# Patient Record
Sex: Male | Born: 1975 | Race: Black or African American | Hispanic: No | Marital: Married | Smoking: Never smoker
Health system: Southern US, Community
[De-identification: ages and names within clinical notes are randomized; demographics above are authoritative.]

## PROBLEM LIST (undated history)

## (undated) DIAGNOSIS — G44009 Cluster headache syndrome, unspecified, not intractable: Secondary | ICD-10-CM

## (undated) HISTORY — DX: Cluster headache syndrome, unspecified, not intractable: G44.009

---

## 2006-03-15 HISTORY — PX: ACHILLES TENDON SURGERY: SHX542

## 2014-07-09 ENCOUNTER — Telehealth: Payer: Self-pay | Admitting: *Deleted

## 2014-07-09 NOTE — Telephone Encounter (Signed)
Unable to reach patient at time of Pre-Visit Call.  Left message for patient to return call when available.    

## 2014-07-10 ENCOUNTER — Telehealth: Payer: Self-pay

## 2014-07-10 ENCOUNTER — Encounter: Payer: Self-pay | Admitting: Physician Assistant

## 2014-07-10 ENCOUNTER — Ambulatory Visit (INDEPENDENT_AMBULATORY_CARE_PROVIDER_SITE_OTHER): Payer: 59 | Admitting: Physician Assistant

## 2014-07-10 ENCOUNTER — Ambulatory Visit (HOSPITAL_BASED_OUTPATIENT_CLINIC_OR_DEPARTMENT_OTHER)
Admission: RE | Admit: 2014-07-10 | Discharge: 2014-07-10 | Disposition: A | Discharge status: Home / Self Care | Payer: 59 | Source: Ambulatory Visit | Attending: Physician Assistant | Admitting: Physician Assistant

## 2014-07-10 VITALS — BP 126/80 | HR 79 | Temp 98.2°F | Resp 18 | Ht 71.75 in | Wt 204.0 lb

## 2014-07-10 DIAGNOSIS — R079 Chest pain, unspecified: Secondary | ICD-10-CM

## 2014-07-10 MED ORDER — MELOXICAM 15 MG PO TABS: 15.0000 mg | ORAL_TABLET | Freq: Every day | ORAL | Status: DC

## 2014-07-10 NOTE — Patient Instructions (Signed)
Please go downstairs for x-ray. I will call you with your results. Please take the Mobic daily with food. Apply topical Aspercreme to the area. Limit heavy lifting.  Follow-up in 1 month.

## 2014-07-10 NOTE — Progress Notes (Signed)
   Patient presents to clinic today to establish care.  Patient endorses R-sided pectoral pain intermittently x 2 months.  Endorses injury while zip-lining. Endorses symptoms initially improved, but have recurred.  Is sometimes worse with movement.  Denies SOB, palpitations, dizziness, lightheadedness, vision changes. BP 126/80. Has not taken anything for symptoms. Has previously seen an Orthopedic Surgeon but states " they were not helpful"   Past Medical History  Diagnosis Date  . Cluster headache     Past Surgical History  Procedure Laterality Date  . Achilles tendon surgery  2008    No current outpatient prescriptions on file prior to visit.   No current facility-administered medications on file prior to visit.    Allergies  Allergen Reactions  . Penicillins Other (See Comments)    Unsure childhood allergy     Family History  Problem Relation Age of Onset  . Hypertension Mother   . Alcohol abuse Father     History   Social History  . Marital Status: Married    Spouse Name: N/A  . Number of Children: N/A  . Years of Education: N/A   Occupational History  . Not on file.   Social History Main Topics  . Smoking status: Never Smoker   . Smokeless tobacco: Not on file  . Alcohol Use: Yes  . Drug Use: No  . Sexual Activity: Not on file   Other Topics Concern  . Not on file   Social History Narrative  . No narrative on file   ROS Pertinent ROS are listed in the HPI.  BP 126/80 mmHg  Pulse 79  Temp(Src) 98.2 F (36.8 C) (Oral)  Resp 18  Ht 5' 11.75" (1.822 m)  Wt 204 lb (92.534 kg)  BMI 27.87 kg/m2  SpO2 98%  Physical Exam  Constitutional: He is well-developed, well-nourished, and in no distress.  HENT:  Head: Normocephalic and atraumatic.  Eyes: Conjunctivae are normal.  Cardiovascular: Normal rate, regular rhythm, normal heart sounds and intact distal pulses.   Pulmonary/Chest: Effort normal and breath sounds normal. No respiratory distress.  He has no wheezes. He has no rales. He exhibits tenderness.  Neurological: He is alert.  Skin: Skin is warm and dry.  Psychiatric: Affect normal.  Vitals reviewed.  Assessment/Plan: Right-sided chest pain Will obtain x-ray to further assess.  Seems MSK in nature. Will begin Mobic.  RICE therapy discussed.  Follow-up in 1 month.

## 2014-07-10 NOTE — Progress Notes (Signed)
Pre visit review using our clinic review tool, if applicable. No additional management support is needed unless otherwise documented below in the visit note. 

## 2014-07-10 NOTE — Telephone Encounter (Signed)
-----   Message from Waldon MerlWilliam C Martin, PA-C sent at 07/10/2014 11:10 AM EDT ----- X-ray negative for concerning findings.  Continue care discussed at visit.  Follow-up in 1 month.

## 2014-07-10 NOTE — Telephone Encounter (Signed)
Notified of results and verbalized understanding. No questions at this time.

## 2014-07-10 NOTE — Assessment & Plan Note (Signed)
Will obtain x-ray to further assess.  Seems MSK in nature. Will begin Mobic.  RICE therapy discussed.  Follow-up in 1 month.

## 2014-08-09 ENCOUNTER — Ambulatory Visit: Payer: 59 | Admitting: Physician Assistant

## 2014-08-09 DIAGNOSIS — Z0289 Encounter for other administrative examinations: Secondary | ICD-10-CM

## 2014-08-16 ENCOUNTER — Telehealth: Payer: Self-pay | Admitting: Physician Assistant

## 2014-08-16 ENCOUNTER — Encounter: Payer: Self-pay | Admitting: Medical

## 2014-08-16 ENCOUNTER — Ambulatory Visit (INDEPENDENT_AMBULATORY_CARE_PROVIDER_SITE_OTHER): Payer: 59 | Admitting: Medical

## 2014-08-16 VITALS — BP 128/90 | HR 82 | Temp 97.9°F | Ht 71.75 in | Wt 212.4 lb

## 2014-08-16 DIAGNOSIS — J040 Acute laryngitis: Secondary | ICD-10-CM

## 2014-08-16 DIAGNOSIS — S29091D Other injury of muscle and tendon of front wall of thorax, subsequent encounter: Secondary | ICD-10-CM

## 2014-08-16 DIAGNOSIS — J301 Allergic rhinitis due to pollen: Secondary | ICD-10-CM

## 2014-08-16 DIAGNOSIS — S29011D Strain of muscle and tendon of front wall of thorax, subsequent encounter: Secondary | ICD-10-CM

## 2014-08-16 DIAGNOSIS — J309 Allergic rhinitis, unspecified: Secondary | ICD-10-CM | POA: Insufficient documentation

## 2014-08-16 MED ORDER — LORATADINE 10 MG PO TABS: 10.0000 mg | ORAL_TABLET | Freq: Every day | ORAL | Status: DC

## 2014-08-16 MED ORDER — FLUTICASONE PROPIONATE 50 MCG/ACT NA SUSP: 2.0000 | Freq: Every day | NASAL | Status: DC

## 2014-08-16 MED ORDER — AZITHROMYCIN 250 MG PO TABS: ORAL_TABLET | ORAL | Status: DC

## 2014-08-16 NOTE — Patient Instructions (Signed)
Laryngitis Rest voice, hydrate. If persists by Monday start azithromycin. If any persisting hoarse voice by 2 wks then would need to refer to ENT.   Allergic rhinitis Rx flonase and claritin.(recent hx and physical exam supports dx of allergy).    Follow up 7 days or as needed

## 2014-08-16 NOTE — Telephone Encounter (Signed)
No charge 1st time.  Will charge if happens again.

## 2014-08-16 NOTE — Progress Notes (Signed)
   Subjective:    Patient ID: Preston Cox, male    DOB: 1975/06/16, 39 y.o.   MRN: [id number]  HPI  Pt in with hoarse voice since Tuesday. Last weekend felt cough, congestion and runny nose. Overall feels better but still occasioanal dry cough.(Not completely better). Occasional sneeze. No st. No fevers, no chills or sweats recently. Then states maybe some fever over weekend but never check his temp.   Pt does not smoke.  Pt has some rt side pectoralis pain for 3 months. Pain on movement works out occasionally. Pt had xray. Was negative. Pt wants to see sports medicine. Ortho did not offer much advise per pt. No left side pain.No back pain. No sob. Mentioned rt pec at very end of intermiew as about to leave room.   Review of Systems See hpi    Objective:   Physical Exam  General  Mental Status - Alert. General Appearance - Well groomed. Not in acute distress. But hoarse voice.  Skin Rashes- No Rashes.  HEENT Head- Normal. Ear Auditory Canal - Left- Normal. Right - Normal.Tympanic Membrane- Left- Normal. Right- Normal. Eye Sclera/Conjunctiva- Left- Normal. Right- Normal. Nose & Sinuses Nasal Mucosa- Left-  Boggy and Congested. Right-  Boggy and  Congested.Bilateral no  maxillary and  No frontal sinus pressure. Mouth & Throat Lips: Upper Lip- Normal: no dryness, cracking, pallor, cyanosis, or vesicular eruption. Lower Lip-Normal: no dryness, cracking, pallor, cyanosis or vesicular eruption. Buccal Mucosa- Bilateral- No Aphthous ulcers. Oropharynx- No Discharge or Erythema. +pnd. Tonsils: Characteristics- Bilateral- No Erythema or Congestion. Size/Enlargement- Bilateral- No enlargement. Discharge- bilateral-None.  Neck Neck- Supple. No Masses.   Chest and Lung Exam Auscultation: Breath Sounds:-Clear even and unlabored.  Cardiovascular Auscultation:Rythm- Regular, rate and rhythm. Murmurs & Other Heart Sounds:Ausculatation of the heart reveal- No  Murmurs.  Lymphatic Head & Neck General Head & Neck Lymphatics: Bilateral: Description- No Localized lymphadenopathy.  Rt pec- on lifting shoulder reports mid upper pec faint paint.       Assessment & Plan:

## 2014-08-16 NOTE — Telephone Encounter (Signed)
Patient was no show for follow up on 08/09/14- charge?

## 2014-08-16 NOTE — Assessment & Plan Note (Signed)
Rest voice, hydrate. If persists by Monday start azithromycin. If any persisting hoarse voice by 2 wks then would need to refer to ENT.

## 2014-08-16 NOTE — Assessment & Plan Note (Signed)
Rx flonase and claritin.(recent hx and physical exam supports dx of allergy).

## 2014-08-16 NOTE — Progress Notes (Signed)
Pre visit review using our clinic review tool, if applicable. No additional management support is needed unless otherwise documented below in the visit note. 

## 2014-08-21 ENCOUNTER — Ambulatory Visit: Payer: 59 | Admitting: Family Medicine

## 2014-11-15 ENCOUNTER — Telehealth: Payer: Self-pay | Admitting: Physician Assistant

## 2014-11-15 NOTE — Telephone Encounter (Signed)
[facility] - Client TELEPHONE ADVICE RECORD [facility] Patient Name: Preston Cox Gender: Male DOB: [birth year] Age: 40 Y 7 M 16 D Return Phone Number: [phone] (Primary) Address: City/State/Zip: [place] [facility] - Client Client Site [facility] - Day Physician Saguier, Edward Contact Type Call Call Type Triage / Clinical Relationship To Patient Self Appointment Disposition EMR Appointment Not Necessary Info pasted into Epic Yes Return Phone Number [phone] (Primary) Chief Complaint CHEST PAIN (>=21 years) - pain, pressure, heaviness or tightness Initial Comment Caller states having tightness in chest breathing is labored PreDisposition Call Doctor Nurse Assessment Nurse: Roderic Ovens, RN, Amy Date/Time (Eastern Time): 11/15/2014 3:55:20 PM Confirm and document reason for call. If symptomatic, describe symptoms. ---CALLER STATES THAT HE IS HAVING SOME CHEST TIGHTNESS. HE STATES THE CHEST TIGHTNESS HAS BEEN STAYING THERE FOR A COUPLE OF DAYS. HE IS HAVING SOB AS WELL. RIGHT SIDE OF HIS CHEST IS SHARP PAIN CONSTANT PAIN. Has the patient traveled out of the country within the last 30 days? ---Not Applicable Does the patient require triage? ---Yes Related visit to physician within the last 2 weeks? ---No Does the PT have any chronic conditions? (i.e. diabetes, asthma, etc.) ---No Guidelines Guideline Title Affirmed Question Affirmed Notes Nurse Date/Time (Eastern Time) Chest Pain Patient sounds very sick or weak to the triager HAS SOME DIFFICULTY BREATHING ISSUES, CHEST PAIN/ DISCOMFORT ISSUES GOING ON. HE DOES NOT KNOW HOW TO REALLY DESCRIBE THE ISSUES HE IS HAVING. HE STATES THIS HAS BEEN GOING ON FOR ABOUT 3 DAYS NOW. [place], RN, Amy 11/15/2014 3:56:13 PM PLEASE NOTE: All timestamps contained within this report are represented as [place] Standard  Time. CONFIDENTIALTY NOTICE: This fax transmission is intended only for the addressee. It contains information that is legally privileged, confidential or otherwise protected from use or disclosure. If you are not the intended recipient, you are strictly prohibited from reviewing, disclosing, copying using or disseminating any of this information or taking any action in reliance on or regarding this information. If you have received this fax in error, please notify us immediately by telephone so that we can arrange for its return to [place]. Phone: [phone], Toll-Free: [phone], Fax: [phone] Page: 2 of 2 Call Id: [id number] Disp. Time Lamount Cohen Time) Disposition Final User 11/15/2014 3:49:08 PM Send to Urgent [place]oy Register, Lorie 11/15/2014 4:04:47 PM Go to ED Now (or PCP triage) Yes Roderic Ovens, RN, Amy Caller Understands: Yes Disagree/Comply: Disagree Disagree/Comply Reason: Disagree with instructions Care Advice Given Per Guideline GO TO ED NOW (OR PCP TRIAGE): CARE ADVICE given per Chest Pain (Adult) guideline. DRIVING: Another adult should drive. After Care Instructions Given Call Event Type User Date / Time Description Comments User: Brent Bulla, RN Date/Time Lamount Cohen Time): 11/15/2014 4:07:13 PM CALLER IS REFUSING TO HAVE ED OUTCOME. HE ONLY WANTS TO SCHEDULE AN APPT. INFORMED HIM WOULD CALL THE OFFICE AND SPEAK WITH THE NURSE AND LEAVE THE INFORMATION WITH THEM. SOMEONE FROM THE OFFICE WILL CALL HIM BACK. Referrals GO TO FACILITY REFUSED

## 2014-11-15 NOTE — Telephone Encounter (Signed)
Called patient at [phone] (Mobile) *Preferred* and left message to return call.  

## 2014-11-15 NOTE — Telephone Encounter (Signed)
Caller name: Amy Relationship to patient: Triage Nurse  Can be reached:  Pharmacy:  Reason for call: called in was having chest tightening. She would just like to let you know that pt refuses to go to the ER he only want to come into the office

## 2014-11-15 NOTE — Telephone Encounter (Signed)
Please call patient and advise him to be seen at an Urgent Care today if he will not go to the ER.

## 2014-11-20 NOTE — Telephone Encounter (Signed)
Called patient at [phone] [facility]) *Preferred* and left message to return call.

## 2014-12-17 ENCOUNTER — Encounter: Payer: Self-pay | Admitting: Physician Assistant

## 2014-12-17 ENCOUNTER — Ambulatory Visit (INDEPENDENT_AMBULATORY_CARE_PROVIDER_SITE_OTHER): Payer: 59 | Admitting: Physician Assistant

## 2014-12-17 VITALS — BP 137/78 | HR 72 | Temp 97.9°F | Resp 16 | Ht 71.75 in | Wt 215.4 lb

## 2014-12-17 DIAGNOSIS — R0789 Other chest pain: Secondary | ICD-10-CM | POA: Diagnosis not present

## 2014-12-17 MED ORDER — MONTELUKAST SODIUM 10 MG PO TABS: 10.0000 mg | ORAL_TABLET | Freq: Every day | ORAL | Status: DC

## 2014-12-17 NOTE — Progress Notes (Signed)
   Patient presents to clinic today c/o intermittent chest pressure over the past month. Has also had some mild allergy symptoms. Denies overt chest pain, SOB, palpitations, LH or dizziness. Has had recent x-ray for R-sided MSK chest pain that was unremarkable. Is not taking anything for his symptoms.  BP Readings from Last 3 Encounters:  12/17/14 137/78  08/16/14 128/90  07/10/14 126/80    Past Medical History  Diagnosis Date  . Cluster headache     No current outpatient prescriptions on file prior to visit.   No current facility-administered medications on file prior to visit.    Allergies  Allergen Reactions  . Penicillins Other (See Comments)    Unsure childhood allergy     Family History  Problem Relation Age of Onset  . Hypertension Mother   . Alcohol abuse Father     Social History   Social History  . Marital Status: Married    Spouse Name: N/A  . Number of Children: N/A  . Years of Education: N/A   Social History Main Topics  . Smoking status: Never Smoker   . Smokeless tobacco: None  . Alcohol Use: Yes  . Drug Use: No  . Sexual Activity: Not Asked   Other Topics Concern  . None   Social History Narrative   Review of Systems - See HPI.  All other ROS are negative.  BP 137/78 mmHg  Pulse 72  Temp(Src) 97.9 F (36.6 C) (Oral)  Resp 16  Ht 5' 11.75" (1.822 m)  Wt 215 lb 6 oz (97.693 kg)  BMI 29.43 kg/m2  SpO2 100%  Physical Exam  Constitutional: He is oriented to person, place, and time and well-developed, well-nourished, and in no distress.  HENT:  Head: Normocephalic and atraumatic.  Eyes: Conjunctivae are normal.  Cardiovascular: Normal rate, regular rhythm, normal heart sounds and intact distal pulses.   Pulmonary/Chest: Effort normal and breath sounds normal. No respiratory distress. He has no wheezes. He has no rales. He exhibits no tenderness.  Neurological: He is alert and oriented to person, place, and time.  Skin: Skin is warm and  dry. No rash noted.  Psychiatric: Affect normal.  Vitals reviewed.   No results found for this or any previous visit (from the past 2160 hour(s)).  Assessment/Plan: Chest tightness Recent negative CXR. EKG with NSR. Exam unremarkable. 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 Will resume Flonase and begin Singulair daily for 2 week trial. If not responding, will refer to Pulmonology.

## 2014-12-17 NOTE — Progress Notes (Signed)
Pre visit review using our clinic review tool, if applicable. No additional management support is needed unless otherwise documented below in the visit note/SLS  

## 2014-12-17 NOTE — Patient Instructions (Addendum)
Please take the Singulair daily over the next 2 weeks to see if this improves symptoms. I suspect there is some mild allergic reactive airway as cause of your symptoms. If symptoms not improving, we will need to get you in to Pulmonology for assessment.

## 2014-12-17 NOTE — Assessment & Plan Note (Signed)
Recent negative CXR. EKG with NSR. Exam unremarkable. 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 Will resume Flonase and begin Singulair daily for 2 week trial. If not responding, will refer to Pulmonology.

## 2015-01-30 ENCOUNTER — Telehealth: Payer: Self-pay | Admitting: Behavioral Health

## 2015-01-30 NOTE — Telephone Encounter (Signed)
Unable to reach patient at time of Pre-Visit Call. Message could not be left due to voice mailbox being full.

## 2015-01-31 ENCOUNTER — Encounter: Payer: Self-pay | Admitting: Physician Assistant

## 2015-01-31 ENCOUNTER — Ambulatory Visit (INDEPENDENT_AMBULATORY_CARE_PROVIDER_SITE_OTHER): Payer: 59 | Admitting: Physician Assistant

## 2015-01-31 VITALS — BP 131/76 | HR 70 | Temp 97.9°F | Resp 16 | Ht 72.0 in | Wt 213.2 lb

## 2015-01-31 DIAGNOSIS — Z125 Encounter for screening for malignant neoplasm of prostate: Secondary | ICD-10-CM | POA: Insufficient documentation

## 2015-01-31 DIAGNOSIS — Z0000 Encounter for general adult medical examination without abnormal findings: Secondary | ICD-10-CM | POA: Diagnosis not present

## 2015-01-31 DIAGNOSIS — R079 Chest pain, unspecified: Secondary | ICD-10-CM

## 2015-01-31 DIAGNOSIS — R42 Dizziness and giddiness: Secondary | ICD-10-CM | POA: Diagnosis not present

## 2015-01-31 LAB — COMPREHENSIVE METABOLIC PANEL
ALBUMIN: 4.3 g/dL (ref 3.5–5.2)
ALT: 14 U/L (ref 0–53)
AST: 19 U/L (ref 0–37)
Alkaline Phosphatase: 63 U/L (ref 39–117)
BILIRUBIN TOTAL: 0.4 mg/dL (ref 0.2–1.2)
BUN: 16 mg/dL (ref 6–23)
CALCIUM: 9.5 mg/dL (ref 8.4–10.5)
CHLORIDE: 103 meq/L (ref 96–112)
CO2: 31 mEq/L (ref 19–32)
CREATININE: 1.39 mg/dL (ref 0.40–1.50)
GFR: 72.48 mL/min (ref >60.00)
Glucose, Bld: 82 mg/dL (ref 70–99)
Potassium: 3.7 mEq/L (ref 3.5–5.1)
SODIUM: 142 meq/L (ref 135–145)
TOTAL PROTEIN: 6.9 g/dL (ref 6.0–8.3)

## 2015-01-31 LAB — LIPID PANEL
Cholesterol: 165 mg/dL (ref 0–200)
HDL: 51.1 mg/dL (ref >39.00)
LDL Cholesterol: 100 mg/dL — ABNORMAL HIGH (ref 0–99)
NONHDL: 113.86
TRIGLYCERIDES: 71 mg/dL (ref 0.0–149.0)
Total CHOL/HDL Ratio: 3
VLDL: 14.2 mg/dL (ref 0.0–40.0)

## 2015-01-31 LAB — URINALYSIS, ROUTINE W REFLEX MICROSCOPIC
BILIRUBIN URINE: NEGATIVE
HGB URINE DIPSTICK: NEGATIVE
Ketones, ur: NEGATIVE
LEUKOCYTES UA: NEGATIVE
NITRITE: NEGATIVE
PH: 6.5 (ref 5.0–8.0)
RBC / HPF: NONE SEEN (ref 0–?)
Specific Gravity, Urine: 1.02 (ref 1.000–1.030)
TOTAL PROTEIN, URINE-UPE24: NEGATIVE
Urine Glucose: NEGATIVE
Urobilinogen, UA: 0.2 (ref 0.0–1.0)
WBC, UA: NONE SEEN (ref 0–?)

## 2015-01-31 LAB — CBC
HCT: 44 % (ref 39.0–52.0)
Hemoglobin: 14.7 g/dL (ref 13.0–17.0)
MCHC: 33.5 g/dL (ref 30.0–36.0)
MCV: 85.8 fl (ref 78.0–100.0)
PLATELETS: 203 10*3/uL (ref 150.0–400.0)
RBC: 5.13 Mil/uL (ref 4.22–5.81)
RDW: 13.5 % (ref 11.5–15.5)
WBC: 3.8 10*3/uL — ABNORMAL LOW (ref 4.0–10.5)

## 2015-01-31 LAB — PSA: PSA: 1.08 ng/mL (ref 0.10–4.00)

## 2015-01-31 LAB — TSH: TSH: 1.2 u[IU]/mL (ref 0.35–4.50)

## 2015-01-31 LAB — HEMOGLOBIN A1C: HEMOGLOBIN A1C: 5.4 % (ref 4.6–6.5)

## 2015-01-31 MED ORDER — MECLIZINE HCL 25 MG PO TABS: 25.0000 mg | ORAL_TABLET | Freq: Three times a day (TID) | ORAL | Status: DC | PRN

## 2015-01-31 NOTE — Progress Notes (Signed)
Pre visit review using our clinic review tool, if applicable. No additional management support is needed unless otherwise documented below in the visit note/SLS  

## 2015-01-31 NOTE — Progress Notes (Signed)
Patient presents to clinic today for annual exam.  Patient is fasting for labs.  Acute Concerns: Patient c/o mild vertigo over the past week associated with ear pressure and nasal congestion. Denies change in hearing. Denies vision changes or headache. Denies sinus pain, fever, chills or nausea.  Patient endorses continued R-sided chest pain with ROM. Previous EKG, CXR negative. No worsening or new symptoms. Would like to proceed with sports medicine assessment.  Health Maintenance: Dental -- up-to-date Vision -- no corrective lenses. Immunizations -- Flu shot today. TDaP declines.  Past Medical History  Diagnosis Date  . Cluster headache     Past Surgical History  Procedure Laterality Date  . Achilles tendon surgery  2008    No current outpatient prescriptions on file prior to visit.   No current facility-administered medications on file prior to visit.    Allergies  Allergen Reactions  . Penicillins Other (See Comments)    Unsure childhood allergy     Family History  Problem Relation Age of Onset  . Hypertension Mother   . Alcohol abuse Father     Social History   Social History  . Marital Status: Married    Spouse Name: N/A  . Number of Children: N/A  . Years of Education: N/A   Occupational History  . Not on file.   Social History Main Topics  . Smoking status: Never Smoker   . Smokeless tobacco: Not on file  . Alcohol Use: Yes  . Drug Use: No  . Sexual Activity: Not on file   Other Topics Concern  . Not on file   Social History Narrative   Review of Systems  Constitutional: Negative for fever and weight loss.  HENT: Negative for ear discharge, ear pain, hearing loss and tinnitus.   Eyes: Negative for blurred vision, double vision, photophobia and pain.  Respiratory: Negative for cough and shortness of breath.   Cardiovascular: Negative for chest pain and palpitations.  Gastrointestinal: Negative for heartburn, nausea, vomiting, abdominal  pain, diarrhea, constipation, blood in stool and melena.  Genitourinary: Negative for dysuria, urgency, frequency, hematuria and flank pain.  Musculoskeletal: Positive for myalgias. Negative for falls.  Neurological: Positive for dizziness. Negative for loss of consciousness and headaches.  Endo/Heme/Allergies: Negative for environmental allergies.  Psychiatric/Behavioral: Negative for depression, suicidal ideas, hallucinations and substance abuse. The patient is not nervous/anxious and does not have insomnia.    BP 131/76 mmHg  Pulse 70  Temp(Src) 97.9 F (36.6 C) (Oral)  Resp 16  Ht 6' (1.829 m)  Wt 213 lb 4 oz (96.73 kg)  BMI 28.92 kg/m2  SpO2 100%  Physical Exam  Constitutional: He is oriented to person, place, and time and well-developed, well-nourished, and in no distress.  HENT:  Head: Normocephalic and atraumatic.  Right Ear: External ear normal.  Left Ear: External ear normal.  Nose: Nose normal.  Mouth/Throat: Oropharynx is clear and moist. No oropharyngeal exudate.  Clear fluid noted behind TM bilaterally.  Eyes: Conjunctivae and EOM are normal. Pupils are equal, round, and reactive to light.  Neck: Neck supple. No thyromegaly present.  Cardiovascular: Normal rate, regular rhythm, normal heart sounds and intact distal pulses.   Pulmonary/Chest: Effort normal and breath sounds normal. No respiratory distress. He has no wheezes. He has no rales. He exhibits no tenderness.  Abdominal: Soft. Bowel sounds are normal. He exhibits no distension and no mass. There is no tenderness. There is no rebound and no guarding.  Genitourinary: Testes/scrotum normal and penis  normal. No discharge found.  Lymphadenopathy:    He has no cervical adenopathy.  Neurological: He is alert and oriented to person, place, and time.  Skin: Skin is warm and dry. No rash noted.  Psychiatric: Affect normal.  Vitals reviewed.   Recent Results (from the past 2160 hour(s))  CBC     Status: Abnormal    Collection Time: 01/31/15  8:15 AM  Result Value Ref Range   WBC 3.8 (L) 4.0 - 10.5 K/uL   RBC 5.13 4.22 - 5.81 Mil/uL   Platelets 203.0 150.0 - 400.0 K/uL   Hemoglobin 14.7 13.0 - 17.0 g/dL   HCT 44.0 39.0 - 52.0 %   MCV 85.8 78.0 - 100.0 fl   MCHC 33.5 30.0 - 36.0 g/dL   RDW 13.5 11.5 - 15.5 %  Comp Met (CMET)     Status: None   Collection Time: 01/31/15  8:15 AM  Result Value Ref Range   Sodium 142 135 - 145 mEq/L   Potassium 3.7 3.5 - 5.1 mEq/L   Chloride 103 96 - 112 mEq/L   CO2 31 19 - 32 mEq/L   Glucose, Bld 82 70 - 99 mg/dL   BUN 16 6 - 23 mg/dL   Creatinine, Ser 1.39 0.40 - 1.50 mg/dL   Total Bilirubin 0.4 0.2 - 1.2 mg/dL   Alkaline Phosphatase 63 39 - 117 U/L   AST 19 0 - 37 U/L   ALT 14 0 - 53 U/L   Total Protein 6.9 6.0 - 8.3 g/dL   Albumin 4.3 3.5 - 5.2 g/dL   Calcium 9.5 8.4 - 10.5 mg/dL   GFR 72.48 >60.00 mL/min  TSH     Status: None   Collection Time: 01/31/15  8:15 AM  Result Value Ref Range   TSH 1.20 0.35 - 4.50 uIU/mL  Hemoglobin A1c     Status: None   Collection Time: 01/31/15  8:15 AM  Result Value Ref Range   Hgb A1c MFr Bld 5.4 4.6 - 6.5 %    Comment: Glycemic Control Guidelines for People with Diabetes:Non Diabetic:  <6%Goal of Therapy: <7%Additional Action Suggested:  >8%   Urinalysis, Routine w reflex microscopic (not at [facility])     Status: None   Collection Time: 01/31/15  8:15 AM  Result Value Ref Range   Color, Urine YELLOW Yellow;Lt. Yellow   APPearance CLEAR Clear   Specific Gravity, Urine 1.020 1.000-1.030   pH 6.5 5.0 - 8.0   Total Protein, Urine NEGATIVE Negative   Urine Glucose NEGATIVE Negative   Ketones, ur NEGATIVE Negative   Bilirubin Urine NEGATIVE Negative   Hgb urine dipstick NEGATIVE Negative   Urobilinogen, UA 0.2 0.0 - 1.0   Leukocytes, UA NEGATIVE Negative   Nitrite NEGATIVE Negative   WBC, UA none seen 0-2/hpf   RBC / HPF none seen 0-2/hpf   Squamous Epithelial / LPF Rare(0-4/hpf) Rare(0-4/hpf)  Lipid Profile      Status: Abnormal   Collection Time: 01/31/15  8:15 AM  Result Value Ref Range   Cholesterol 165 0 - 200 mg/dL    Comment: ATP III Classification       Desirable:  < 200 mg/dL               Borderline High:  200 - 239 mg/dL          High:  > = 240 mg/dL   Triglycerides 71.0 0.0 - 149.0 mg/dL    Comment: Normal:  <150 mg/dLBorderline High:  150 - 199 mg/dL   HDL 51.10 >39.00 mg/dL   VLDL 14.2 0.0 - 40.0 mg/dL   LDL Cholesterol 100 (H) 0 - 99 mg/dL   Total CHOL/HDL Ratio 3     Comment:                Men          Women1/2 Average Risk     3.4          3.3Average Risk          5.0          4.42X Average Risk          9.6          7.13X Average Risk          15.0          11.0                       NonHDL 113.86     Comment: NOTE:  Non-HDL goal should be 30 mg/dL higher than patient's LDL goal (i.e. LDL goal of < 70 mg/dL, would have non-HDL goal of < 100 mg/dL)  PSA     Status: None   Collection Time: 01/31/15  8:15 AM  Result Value Ref Range   PSA 1.08 0.10 - 4.00 ng/mL    Assessment/Plan: Prostate cancer screening Will obtain PSA for screening today. Patient asymptomatic and DRE deferred.  Right-sided chest pain Suspect MSK due to negative prior workup and nature of symptoms.  Pain always reproducible with palpation over sternum and R-side of chest. Referral to Sports Medicine placed for further assessment.  Vertigo Suspect due to viral infection and subsequent fluid build up. Rx Meclixine. Recommend Flonase daily and daily Claritin. Increase fluids and rest. Follow-up if symptoms are not resolving.  Visit for preventive health examination Depression screen negative. Health Maintenance reviewed -- Flu shot today. Declined tetanus. Preventive schedule discussed and handout given in AVS. Will obtain fasting labs today.

## 2015-01-31 NOTE — Assessment & Plan Note (Signed)
Suspect due to viral infection and subsequent fluid build up. Rx Meclixine. Recommend Flonase daily and daily Claritin. Increase fluids and rest. Follow-up if symptoms are not resolving.

## 2015-01-31 NOTE — Assessment & Plan Note (Signed)
Depression screen negative. Health Maintenance reviewed -- Flu shot today. Declined tetanus. Preventive schedule discussed and handout given in AVS. Will obtain fasting labs today.

## 2015-01-31 NOTE — Assessment & Plan Note (Signed)
Will obtain PSA for screening today. Patient asymptomatic and DRE deferred.

## 2015-01-31 NOTE — Patient Instructions (Signed)
Please go to the lab for blood work.  I will call you with your results. If your blood work is normal we will follow-up yearly for physicals.  We will treat abnormal findings if present and schedule follow-up visit.  Please take Meclizine as directed when needed for dizziness. Stay well hydrated. Use Flonase daily to help remove fluid from ears. Call or return to clinic if symptoms are not resolving.  You will be contacted by Sports Medicine for further assessment of chronic chest symptoms as they seem musculoskeletal in nature.  Preventive Care for Adults, Male A healthy lifestyle and preventive care can promote health and wellness. Preventive health guidelines for men include the following key practices:  A routine yearly physical is a good way to check with your health care provider about your health and preventative screening. It is a chance to share any concerns and updates on your health and to receive a thorough exam.  Visit your dentist for a routine exam and preventative care every 6 months. Brush your teeth twice a day and floss once a day. Good oral hygiene prevents tooth decay and gum disease.  The frequency of eye exams is based on your age, health, family medical history, use of contact lenses, and other factors. Follow your health care provider's recommendations for frequency of eye exams.  Eat a healthy diet. Foods such as vegetables, fruits, whole grains, low-fat dairy products, and lean protein foods contain the nutrients you need without too many calories. Decrease your intake of foods high in solid fats, added sugars, and salt. Eat the right amount of calories for you.Get information about a proper diet from your health care provider, if necessary.  Regular physical exercise is one of the most important things you can do for your health. Most adults should get at least 150 minutes of moderate-intensity exercise (any activity that increases your heart rate and causes you to  sweat) each week. In addition, most adults need muscle-strengthening exercises on 2 or more days a week.  Maintain a healthy weight. The body mass index (BMI) is a screening tool to identify possible weight problems. It provides an estimate of body fat based on height and weight. Your health care provider can find your BMI and can help you achieve or maintain a healthy weight.For adults 20 years and older:  A BMI below 18.5 is considered underweight.  A BMI of 18.5 to 24.9 is normal.  A BMI of 25 to 29.9 is considered overweight.  A BMI of 30 and above is considered obese.  Maintain normal blood lipids and cholesterol levels by exercising and minimizing your intake of saturated fat. Eat a balanced diet with plenty of fruit and vegetables. Blood tests for lipids and cholesterol should begin at age 25 and be repeated every 5 years. If your lipid or cholesterol levels are high, you are over 50, or you are at high risk for heart disease, you may need your cholesterol levels checked more frequently.Ongoing high lipid and cholesterol levels should be treated with medicines if diet and exercise are not working.  If you smoke, find out from your health care provider how to quit. If you do not use tobacco, do not start.  Lung cancer screening is recommended for adults aged 13-80 years who are at high risk for developing lung cancer because of a history of smoking. A yearly low-dose CT scan of the lungs is recommended for people who have at least a 30-pack-year history of smoking and are  a current smoker or have quit within the past 15 years. A pack year of smoking is smoking an average of 1 pack of cigarettes a day for 1 year (for example: 1 pack a day for 30 years or 2 packs a day for 15 years). Yearly screening should continue until the smoker has stopped smoking for at least 15 years. Yearly screening should be stopped for people who develop a health problem that would prevent them from having lung  cancer treatment.  If you choose to drink alcohol, do not have more than 2 drinks per day. One drink is considered to be 12 ounces (355 mL) of beer, 5 ounces (148 mL) of wine, or 1.5 ounces (44 mL) of liquor.  Avoid use of street drugs. Do not share needles with anyone. Ask for help if you need support or instructions about stopping the use of drugs.  High blood pressure causes heart disease and increases the risk of stroke. Your blood pressure should be checked at least every 1-2 years. Ongoing high blood pressure should be treated with medicines, if weight loss and exercise are not effective.  If you are 55-39 years old, ask your health care provider if you should take aspirin to prevent heart disease.  Diabetes screening is done by taking a blood sample to check your blood glucose level after you have not eaten for a certain period of time (fasting). If you are not overweight and you do not have risk factors for diabetes, you should be screened once every 3 years starting at age 31. If you are overweight or obese and you are 64-59 years of age, you should be screened for diabetes every year as part of your cardiovascular risk assessment.  Colorectal cancer can be detected and often prevented. Most routine colorectal cancer screening begins at the age of 31 and continues through age 42. However, your health care provider may recommend screening at an earlier age if you have risk factors for colon cancer. On a yearly basis, your health care provider may provide home test kits to check for hidden blood in the stool. Use of a small camera at the end of a tube to directly examine the colon (sigmoidoscopy or colonoscopy) can detect the earliest forms of colorectal cancer. Talk to your health care provider about this at age 65, when routine screening begins. Direct exam of the colon should be repeated every 5-10 years through age 32, unless early forms of precancerous polyps or small growths are  found.  People who are at an increased risk for hepatitis B should be screened for this virus. You are considered at high risk for hepatitis B if:  You were born in a country where hepatitis B occurs often. Talk with your health care provider about which countries are considered high risk.  Your parents were born in a high-risk country and you have not received a shot to protect against hepatitis B (hepatitis B vaccine).  You have HIV or AIDS.  You use needles to inject street drugs.  You live with, or have sex with, someone who has hepatitis B.  You are a man who has sex with other men (MSM).  You get hemodialysis treatment.  You take certain medicines for conditions such as cancer, organ transplantation, and autoimmune conditions.  Hepatitis C blood testing is recommended for all people born from [birth year] through 1965 and any individual with known risks for hepatitis C.  Practice safe sex. Use condoms and avoid high-risk sexual practices  to reduce the spread of sexually transmitted infections (STIs). STIs include gonorrhea, chlamydia, syphilis, trichomonas, herpes, HPV, and human immunodeficiency virus (HIV). Herpes, HIV, and HPV are viral illnesses that have no cure. They can result in disability, cancer, and death.  If you are a man who has sex with other men, you should be screened at least once per year for:  HIV.  Urethral, rectal, and pharyngeal infection of gonorrhea, chlamydia, or both.  If you are at risk of being infected with HIV, it is recommended that you take a prescription medicine daily to prevent HIV infection. This is called preexposure prophylaxis (PrEP). You are considered at risk if:  You are a man who has sex with other men (MSM) and have other risk factors.  You are a heterosexual man, are sexually active, and are at increased risk for HIV infection.  You take drugs by injection.  You are sexually active with a partner who has HIV.  Talk with your health  care provider about whether you are at high risk of being infected with HIV. If you choose to begin PrEP, you should first be tested for HIV. You should then be tested every 3 months for as long as you are taking PrEP.  A one-time screening for abdominal aortic aneurysm (AAA) and surgical repair of large AAAs by ultrasound are recommended for men ages 78 to 49 years who are current or former smokers.  Healthy men should no longer receive prostate-specific antigen (PSA) blood tests as part of routine cancer screening. Talk with your health care provider about prostate cancer screening.  Testicular cancer screening is not recommended for adult males who have no symptoms. Screening includes self-exam, a health care provider exam, and other screening tests. Consult with your health care provider about any symptoms you have or any concerns you have about testicular cancer.  Use sunscreen. Apply sunscreen liberally and repeatedly throughout the day. You should seek shade when your shadow is shorter than you. Protect yourself by wearing long sleeves, pants, a wide-brimmed hat, and sunglasses year round, whenever you are outdoors.  Once a month, do a whole-body skin exam, using a mirror to look at the skin on your back. Tell your health care provider about new moles, moles that have irregular borders, moles that are larger than a pencil eraser, or moles that have changed in shape or color.  Stay current with required vaccines (immunizations).  Influenza vaccine. All adults should be immunized every year.  Tetanus, diphtheria, and acellular pertussis (Td, Tdap) vaccine. An adult who has not previously received Tdap or who does not know his vaccine status should receive 1 dose of Tdap. This initial dose should be followed by tetanus and diphtheria toxoids (Td) booster doses every 10 years. Adults with an unknown or incomplete history of completing a 3-dose immunization series with Td-containing vaccines should  begin or complete a primary immunization series including a Tdap dose. Adults should receive a Td booster every 10 years.  Varicella vaccine. An adult without evidence of immunity to varicella should receive 2 doses or a second dose if he has previously received 1 dose.  Human papillomavirus (HPV) vaccine. Males aged 11-21 years who have not received the vaccine previously should receive the 3-dose series. Males aged 22-26 years may be immunized. Immunization is recommended through the age of 10 years for any male who has sex with males and did not get any or all doses earlier. Immunization is recommended for any person with an immunocompromised  condition through the age of 9 years if he did not get any or all doses earlier. During the 3-dose series, the second dose should be obtained 4-8 weeks after the first dose. The third dose should be obtained 24 weeks after the first dose and 16 weeks after the second dose.  Zoster vaccine. One dose is recommended for adults aged 39 years or older unless certain conditions are present.  Measles, mumps, and rubella (MMR) vaccine. Adults born before 65 generally are considered immune to measles and mumps. Adults born in [birth year] or later should have 1 or more doses of MMR vaccine unless there is a contraindication to the vaccine or there is laboratory evidence of immunity to each of the three diseases. A routine second dose of MMR vaccine should be obtained at least 28 days after the first dose for students attending postsecondary schools, health care workers, or international travelers. People who received inactivated measles vaccine or an unknown type of measles vaccine during 1963-1967 should receive 2 doses of MMR vaccine. People who received inactivated mumps vaccine or an unknown type of mumps vaccine before 1979 and are at high risk for mumps infection should consider immunization with 2 doses of MMR vaccine. Unvaccinated health care workers born before 55 who  lack laboratory evidence of measles, mumps, or rubella immunity or laboratory confirmation of disease should consider measles and mumps immunization with 2 doses of MMR vaccine or rubella immunization with 1 dose of MMR vaccine.  Pneumococcal 13-valent conjugate (PCV13) vaccine. When indicated, a person who is uncertain of his immunization history and has no record of immunization should receive the PCV13 vaccine. All adults 25 years of age and older should receive this vaccine. An adult aged 48 years or older who has certain medical conditions and has not been previously immunized should receive 1 dose of PCV13 vaccine. This PCV13 should be followed with a dose of pneumococcal polysaccharide (PPSV23) vaccine. Adults who are at high risk for pneumococcal disease should obtain the PPSV23 vaccine at least 8 weeks after the dose of PCV13 vaccine. Adults older than 40 years of age who have normal immune system function should obtain the PPSV23 vaccine dose at least 1 year after the dose of PCV13 vaccine.  Pneumococcal polysaccharide (PPSV23) vaccine. When PCV13 is also indicated, PCV13 should be obtained first. All adults aged 4 years and older should be immunized. An adult younger than age 90 years who has certain medical conditions should be immunized. Any person who resides in a nursing home or long-term care facility should be immunized. An adult smoker should be immunized. People with an immunocompromised condition and certain other conditions should receive both PCV13 and PPSV23 vaccines. People with human immunodeficiency virus (HIV) infection should be immunized as soon as possible after diagnosis. Immunization during chemotherapy or radiation therapy should be avoided. Routine use of PPSV23 vaccine is not recommended for American Indians, [place] Natives, or people younger than 65 years unless there are medical conditions that require PPSV23 vaccine. When indicated, people who have unknown immunization and  have no record of immunization should receive PPSV23 vaccine. One-time revaccination 5 years after the first dose of PPSV23 is recommended for people aged 19-64 years who have chronic kidney failure, nephrotic syndrome, asplenia, or immunocompromised conditions. People who received 1-2 doses of PPSV23 before age 55 years should receive another dose of PPSV23 vaccine at age 69 years or later if at least 5 years have passed since the previous dose. Doses of PPSV23 are not  needed for people immunized with PPSV23 at or after age 42 years.  Meningococcal vaccine. Adults with asplenia or persistent complement component deficiencies should receive 2 doses of quadrivalent meningococcal conjugate (MenACWY-D) vaccine. The doses should be obtained at least 2 months apart. Microbiologists working with certain meningococcal bacteria, [employer] recruits, people at risk during an outbreak, and people who travel to or live in countries with a high rate of meningitis should be immunized. A first-year college student up through age 36 years who is living in a residence hall should receive a dose if he did not receive a dose on or after his 16th birthday. Adults who have certain high-risk conditions should receive one or more doses of vaccine.  Hepatitis A vaccine. Adults who wish to be protected from this disease, have chronic liver disease, work with hepatitis A-infected animals, work in hepatitis A research labs, or travel to or work in countries with a high rate of hepatitis A should be immunized. Adults who were previously unvaccinated and who anticipate close contact with an international adoptee during the first 60 days after arrival in the [place] from a country with a high rate of hepatitis A should be immunized.  Hepatitis B vaccine. Adults should be immunized if they wish to be protected from this disease, are under age 33 years and have diabetes, have chronic liver disease, have had more than one sex partner in  the past 6 months, may be exposed to blood or other infectious body fluids, are household contacts or sex partners of hepatitis B positive people, are clients or workers in certain care facilities, or travel to or work in countries with a high rate of hepatitis B.  Haemophilus influenzae type b (Hib) vaccine. A previously unvaccinated person with asplenia or sickle cell disease or having a scheduled splenectomy should receive 1 dose of Hib vaccine. Regardless of previous immunization, a recipient of a hematopoietic stem cell transplant should receive a 3-dose series 6-12 months after his successful transplant. Hib vaccine is not recommended for adults with HIV infection. Preventive Service / Frequency Ages 32 to 53  Blood pressure check.** / Every 3-5 years.  Lipid and cholesterol check.** / Every 5 years beginning at age 46.  Hepatitis C blood test.** / For any individual with known risks for hepatitis C.  Skin self-exam. / Monthly.  Influenza vaccine. / Every year.  Tetanus, diphtheria, and acellular pertussis (Tdap, Td) vaccine.** / Consult your health care provider. 1 dose of Td every 10 years.  Varicella vaccine.** / Consult your health care provider.  HPV vaccine. / 3 doses over 6 months, if 37 or younger.  Measles, mumps, rubella (MMR) vaccine.** / You need at least 1 dose of MMR if you were born in 1957 or later. You may also need a second dose.  Pneumococcal 13-valent conjugate (PCV13) vaccine.** / Consult your health care provider.  Pneumococcal polysaccharide (PPSV23) vaccine.** / 1 to 2 doses if you smoke cigarettes or if you have certain conditions.  Meningococcal vaccine.** / 1 dose if you are age 50 to 47 years and a Market researcher living in a residence hall, or have one of several medical conditions. You may also need additional booster doses.  Hepatitis A vaccine.** / Consult your health care provider.  Hepatitis B vaccine.** / Consult your health care  provider.  Haemophilus influenzae type b (Hib) vaccine.** / Consult your health care provider. Ages 15 to 68  Blood pressure check.** / Every year.  Lipid and cholesterol  check.** / Every 5 years beginning at age 69.  Lung cancer screening. / Every year if you are aged 71-80 years and have a 30-pack-year history of smoking and currently smoke or have quit within the past 15 years. Yearly screening is stopped once you have quit smoking for at least 15 years or develop a health problem that would prevent you from having lung cancer treatment.  Fecal occult blood test (FOBT) of stool. / Every year beginning at age 6 and continuing until age 52. You may not have to do this test if you get a colonoscopy every 10 years.  Flexible sigmoidoscopy** or colonoscopy.** / Every 5 years for a flexible sigmoidoscopy or every 10 years for a colonoscopy beginning at age 68 and continuing until age 81.  Hepatitis C blood test.** / For all people born from [birth year] through 1965 and any individual with known risks for hepatitis C.  Skin self-exam. / Monthly.  Influenza vaccine. / Every year.  Tetanus, diphtheria, and acellular pertussis (Tdap/Td) vaccine.** / Consult your health care provider. 1 dose of Td every 10 years.  Varicella vaccine.** / Consult your health care provider.  Zoster vaccine.** / 1 dose for adults aged 30 years or older.  Measles, mumps, rubella (MMR) vaccine.** / You need at least 1 dose of MMR if you were born in 1958 or later. You may also need a second dose.  Pneumococcal 13-valent conjugate (PCV13) vaccine.** / Consult your health care provider.  Pneumococcal polysaccharide (PPSV23) vaccine.** / 1 to 2 doses if you smoke cigarettes or if you have certain conditions.  Meningococcal vaccine.** / Consult your health care provider.  Hepatitis A vaccine.** / Consult your health care provider.  Hepatitis B vaccine.** / Consult your health care provider.  Haemophilus influenzae  type b (Hib) vaccine.** / Consult your health care provider. Ages 54 and over  Blood pressure check.** / Every year.  Lipid and cholesterol check.**/ Every 5 years beginning at age 61.  Lung cancer screening. / Every year if you are aged 43-80 years and have a 30-pack-year history of smoking and currently smoke or have quit within the past 15 years. Yearly screening is stopped once you have quit smoking for at least 15 years or develop a health problem that would prevent you from having lung cancer treatment.  Fecal occult blood test (FOBT) of stool. / Every year beginning at age 37 and continuing until age 15. You may not have to do this test if you get a colonoscopy every 10 years.  Flexible sigmoidoscopy** or colonoscopy.** / Every 5 years for a flexible sigmoidoscopy or every 10 years for a colonoscopy beginning at age 49 and continuing until age 66.  Hepatitis C blood test.** / For all people born from [birth year] through 1965 and any individual with known risks for hepatitis C.  Abdominal aortic aneurysm (AAA) screening.** / A one-time screening for ages 72 to 22 years who are current or former smokers.  Skin self-exam. / Monthly.  Influenza vaccine. / Every year.  Tetanus, diphtheria, and acellular pertussis (Tdap/Td) vaccine.** / 1 dose of Td every 10 years.  Varicella vaccine.** / Consult your health care provider.  Zoster vaccine.** / 1 dose for adults aged 50 years or older.  Pneumococcal 13-valent conjugate (PCV13) vaccine.** / 1 dose for all adults aged 51 years and older.  Pneumococcal polysaccharide (PPSV23) vaccine.** / 1 dose for all adults aged 47 years and older.  Meningococcal vaccine.** / Consult your health care provider.  Hepatitis  A vaccine.** / Consult your health care provider.  Hepatitis B vaccine.** / Consult your health care provider.  Haemophilus influenzae type b (Hib) vaccine.** / Consult your health care provider. **Family history and personal history  of risk and conditions may change your health care provider's recommendations.   This information is not intended to replace advice given to you by your health care provider. Make sure you discuss any questions you have with your health care provider.   Document Released: 04/27/2001 Document Revised: 03/22/2014 Document Reviewed: 07/27/2010 Elsevier Interactive Patient Education [employer].

## 2015-01-31 NOTE — Assessment & Plan Note (Signed)
Suspect MSK due to negative prior workup and nature of symptoms.  Pain always reproducible with palpation over sternum and R-side of chest. Referral to Sports Medicine placed for further assessment.

## 2015-02-05 ENCOUNTER — Other Ambulatory Visit: Payer: Self-pay | Admitting: Physician Assistant

## 2015-02-05 DIAGNOSIS — R0789 Other chest pain: Secondary | ICD-10-CM

## 2015-02-11 ENCOUNTER — Encounter: Payer: Self-pay | Admitting: Family Medicine

## 2015-02-11 ENCOUNTER — Ambulatory Visit (INDEPENDENT_AMBULATORY_CARE_PROVIDER_SITE_OTHER): Payer: 59 | Admitting: Family Medicine

## 2015-02-11 VITALS — BP 147/100 | HR 65 | Ht 72.0 in | Wt 208.0 lb

## 2015-02-11 DIAGNOSIS — R079 Chest pain, unspecified: Secondary | ICD-10-CM

## 2015-02-11 NOTE — Patient Instructions (Signed)
Your history and exam are consistent with an intercostal muscle strain though you're right that this is unusual that it's taking this long to recover. I would take regular aleve 2 tabs twice a day with food for 4-6 weeks. Heat 15 minutes at a time. Consider a pulmonology referral but your lung exam and chest x-ray suggest everything is fine here and it's purely an intercostal strain. Follow up with me as needed. It's unlikely additional imaging (MRI) would be beneficial as well.

## 2015-02-12 NOTE — Progress Notes (Signed)
PCP and consultation requested by: Piedad ClimesMartin, William Cody, PA-C  Subjective:   HPI: Patient is a 39 y.o. male here for chest pain.  Patient denies known injury. He reports having over 7 months of right sided anterior chest pain. Pain comes and goes in severity. Worse in the morning. Pain level 2/10, dull. Has tried nsaids without much relief. Nagging pain but nothing seems to bring this on specifically. Has had chest x-ray and EKG that were normal. No shortness of breath, edema, cough, diaphoresis. No skin changes, fever, sweats.  Past Medical History  Diagnosis Date  . Cluster headache     Current Outpatient Prescriptions on File Prior to Visit  Medication Sig Dispense Refill  . meclizine (ANTIVERT) 25 MG tablet Take 1 tablet (25 mg total) by mouth 3 (three) times daily as needed for dizziness. 45 tablet 0   No current facility-administered medications on file prior to visit.    Past Surgical History  Procedure Laterality Date  . Achilles tendon surgery  2008    Allergies  Allergen Reactions  . Penicillins Other (See Comments)    Unsure childhood allergy     Social History   Social History  . Marital Status: Married    Spouse Name: N/A  . Number of Children: N/A  . Years of Education: N/A   Occupational History  . Not on file.   Social History Main Topics  . Smoking status: Never Smoker   . Smokeless tobacco: Not on file  . Alcohol Use: 0.0 oz/week    0 Standard drinks or equivalent per week  . Drug Use: No  . Sexual Activity: Not on file   Other Topics Concern  . Not on file   Social History Narrative    Family History  Problem Relation Age of Onset  . Hypertension Mother   . Alcohol abuse Father     BP 147/100 mmHg  Pulse 65  Ht 6' (1.829 m)  Wt 208 lb (94.348 kg)  BMI 28.20 kg/m2  Review of Systems: See HPI above.    Objective:  Physical Exam:  Gen: NAD  Chest: symmetric. No deformity of anterior axillary fold or pectoralis  musculature.  FROM shoulders without pain.  Mild tenderness right anterior chest wall.  Could not reproduce pain with shoulder motions or pectoralis flys, simulated bench press. CV: RRR no MRG. Lungs: CTAB without wheezes, rales, rhonchi.    Assessment & Plan:  1. Right sided chest pain - history, chest x-rays (independently reviewed these today), EKG, exam all reassuring.  Suspect intercostal muscle strain though is taking longer than expected for this to resolve.  Advised patient unfortunately there is not much you can do to accelerate healing process of this besides nsaids, heat.  Doubt additional imaging with MRI would be beneficial - same for pulmonology or cardiology referrals given location of pain, lack of other symptoms, normal imaging.  F/u prn.

## 2015-02-12 NOTE — Assessment & Plan Note (Signed)
history, chest x-rays (independently reviewed these today), EKG, exam all reassuring.  Suspect intercostal muscle strain though is taking longer than expected for this to resolve.  Advised patient unfortunately there is not much you can do to accelerate healing process of this besides nsaids, heat.  Doubt additional imaging with MRI would be beneficial - same for pulmonology or cardiology referrals given location of pain, lack of other symptoms, normal imaging.  F/u prn.

## 2015-03-07 ENCOUNTER — Ambulatory Visit (INDEPENDENT_AMBULATORY_CARE_PROVIDER_SITE_OTHER): Payer: 59 | Admitting: Physician Assistant

## 2015-03-07 ENCOUNTER — Encounter: Payer: Self-pay | Admitting: Physician Assistant

## 2015-03-07 VITALS — BP 110/80 | HR 79 | Temp 97.7°F | Ht 72.0 in | Wt 213.0 lb

## 2015-03-07 DIAGNOSIS — M545 Low back pain, unspecified: Secondary | ICD-10-CM

## 2015-03-07 MED ORDER — CYCLOBENZAPRINE HCL 10 MG PO TABS: 10.0000 mg | ORAL_TABLET | Freq: Three times a day (TID) | ORAL | Status: DC | PRN

## 2015-03-07 MED ORDER — NAPROXEN SODIUM 550 MG PO TABS: 550.0000 mg | ORAL_TABLET | Freq: Two times a day (BID) | ORAL | Status: DC

## 2015-03-07 NOTE — Patient Instructions (Signed)
Please avoid heavy lifting or overexertion. Alternate ice and heat to the area in 10-minute intervals. Take Anaprox DS twice daily with food. Take Flexeril as directed -- DO NOT DRIVE WHILE ON MEDICATION  Follow-up if symptoms are not resolving over the next week.

## 2015-03-07 NOTE — Progress Notes (Signed)
Patient presents to clinic today c/o > 2 weeks of low back pain described as pulling and spasms. Endorses symptoms have waxed and waned. Symptoms were improving after 1 week until he wen to the chiropractor for an adjustment. Symptoms have been worse since that time. Denies trauma or injury. Has only taken tylenol for symptom relief.  Past Medical History  Diagnosis Date  . Cluster headache     Current Outpatient Prescriptions on File Prior to Visit  Medication Sig Dispense Refill  . meclizine (ANTIVERT) 25 MG tablet Take 1 tablet (25 mg total) by mouth 3 (three) times daily as needed for dizziness. (Patient not taking: Reported on 03/07/2015) 45 tablet 0  . montelukast (SINGULAIR) 10 MG tablet Reported on 03/07/2015  3   No current facility-administered medications on file prior to visit.    Allergies  Allergen Reactions  . Penicillins Other (See Comments)    Unsure childhood allergy     Family History  Problem Relation Age of Onset  . Hypertension Mother   . Alcohol abuse Father     Social History   Social History  . Marital Status: Married    Spouse Name: N/A  . Number of Children: N/A  . Years of Education: N/A   Social History Main Topics  . Smoking status: Never Smoker   . Smokeless tobacco: None  . Alcohol Use: 0.0 oz/week    0 Standard drinks or equivalent per week  . Drug Use: No  . Sexual Activity: Not Asked   Other Topics Concern  . None   Social History Narrative    Review of Systems - See HPI.  All other ROS are negative.  BP 110/80 mmHg  Pulse 79  Temp(Src) 97.7 F (36.5 C) (Oral)  Ht 6' (1.829 m)  Wt 213 lb (96.616 kg)  BMI 28.88 kg/m2  SpO2 99%  Physical Exam  Constitutional: He is oriented to person, place, and time and well-developed, well-nourished, and in no distress.  HENT:  Head: Normocephalic and atraumatic.  Cardiovascular: Normal rate, regular rhythm, normal heart sounds and intact distal pulses.   Pulmonary/Chest: Effort  normal and breath sounds normal. No respiratory distress. He has no wheezes. He has no rales. He exhibits no tenderness.  Musculoskeletal:       Lumbar back: He exhibits tenderness, pain and spasm. He exhibits normal range of motion, no bony tenderness and no swelling.  Neurological: He is alert and oriented to person, place, and time.  Skin: Skin is warm and dry.  Vitals reviewed.   Recent Results (from the past 2160 hour(s))  CBC     Status: Abnormal   Collection Time: 01/31/15  8:15 AM  Result Value Ref Range   WBC 3.8 (L) 4.0 - 10.5 K/uL   RBC 5.13 4.22 - 5.81 Mil/uL   Platelets 203.0 150.0 - 400.0 K/uL   Hemoglobin 14.7 13.0 - 17.0 g/dL   HCT 44.0 39.0 - 52.0 %   MCV 85.8 78.0 - 100.0 fl   MCHC 33.5 30.0 - 36.0 g/dL   RDW 13.5 11.5 - 15.5 %  Comp Met (CMET)     Status: None   Collection Time: 01/31/15  8:15 AM  Result Value Ref Range   Sodium 142 135 - 145 mEq/L   Potassium 3.7 3.5 - 5.1 mEq/L   Chloride 103 96 - 112 mEq/L   CO2 31 19 - 32 mEq/L   Glucose, Bld 82 70 - 99 mg/dL   BUN 16 6 -  23 mg/dL   Creatinine, Ser 1.39 0.40 - 1.50 mg/dL   Total Bilirubin 0.4 0.2 - 1.2 mg/dL   Alkaline Phosphatase 63 39 - 117 U/L   AST 19 0 - 37 U/L   ALT 14 0 - 53 U/L   Total Protein 6.9 6.0 - 8.3 g/dL   Albumin 4.3 3.5 - 5.2 g/dL   Calcium 9.5 8.4 - 10.5 mg/dL   GFR 72.48 >60.00 mL/min  TSH     Status: None   Collection Time: 01/31/15  8:15 AM  Result Value Ref Range   TSH 1.20 0.35 - 4.50 uIU/mL  Hemoglobin A1c     Status: None   Collection Time: 01/31/15  8:15 AM  Result Value Ref Range   Hgb A1c MFr Bld 5.4 4.6 - 6.5 %    Comment: Glycemic Control Guidelines for People with Diabetes:Non Diabetic:  <6%Goal of Therapy: <7%Additional Action Suggested:  >8%   Urinalysis, Routine w reflex microscopic (not at [facility])     Status: None   Collection Time: 01/31/15  8:15 AM  Result Value Ref Range   Color, Urine YELLOW Yellow;Lt. Yellow   APPearance CLEAR Clear   Specific Gravity,  Urine 1.020 1.000-1.030   pH 6.5 5.0 - 8.0   Total Protein, Urine NEGATIVE Negative   Urine Glucose NEGATIVE Negative   Ketones, ur NEGATIVE Negative   Bilirubin Urine NEGATIVE Negative   Hgb urine dipstick NEGATIVE Negative   Urobilinogen, UA 0.2 0.0 - 1.0   Leukocytes, UA NEGATIVE Negative   Nitrite NEGATIVE Negative   WBC, UA none seen 0-2/hpf   RBC / HPF none seen 0-2/hpf   Squamous Epithelial / LPF Rare(0-4/hpf) Rare(0-4/hpf)  Lipid Profile     Status: Abnormal   Collection Time: 01/31/15  8:15 AM  Result Value Ref Range   Cholesterol 165 0 - 200 mg/dL    Comment: ATP III Classification       Desirable:  < 200 mg/dL               Borderline High:  200 - 239 mg/dL          High:  > = 240 mg/dL   Triglycerides 71.0 0.0 - 149.0 mg/dL    Comment: Normal:  <150 mg/dLBorderline High:  150 - 199 mg/dL   HDL 51.10 >39.00 mg/dL   VLDL 14.2 0.0 - 40.0 mg/dL   LDL Cholesterol 100 (H) 0 - 99 mg/dL   Total CHOL/HDL Ratio 3     Comment:                Men          Women1/2 Average Risk     3.4          3.3Average Risk          5.0          4.42X Average Risk          9.6          7.13X Average Risk          15.0          11.0                       NonHDL 113.86     Comment: NOTE:  Non-HDL goal should be 30 mg/dL higher than patient's LDL goal (i.e. LDL goal of < 70 mg/dL, would have non-HDL goal of < 100 mg/dL)  PSA  Status: None   Collection Time: 01/31/15  8:15 AM  Result Value Ref Range   PSA 1.08 0.10 - 4.00 ng/mL    Assessment/Plan: Right-sided low back pain without sciatica MSK with spasm. Rx Anaprox DS BID with food. Rx Flexeril. Supportive measures reviewed. Follow-up if not resolving.

## 2015-03-07 NOTE — Progress Notes (Signed)
Pre visit review using our clinic review tool, if applicable. No additional management support is needed unless otherwise documented below in the visit note. 

## 2015-03-07 NOTE — Assessment & Plan Note (Signed)
MSK with spasm. Rx Anaprox DS BID with food. Rx Flexeril. Supportive measures reviewed. Follow-up if not resolving.

## 2015-07-11 ENCOUNTER — Ambulatory Visit (INDEPENDENT_AMBULATORY_CARE_PROVIDER_SITE_OTHER): Payer: 59 | Admitting: Medical

## 2015-07-11 ENCOUNTER — Encounter: Payer: Self-pay | Admitting: Medical

## 2015-07-11 VITALS — BP 114/78 | HR 77 | Temp 98.3°F | Ht 72.0 in | Wt 213.2 lb

## 2015-07-11 DIAGNOSIS — R42 Dizziness and giddiness: Secondary | ICD-10-CM

## 2015-07-11 DIAGNOSIS — R5383 Other fatigue: Secondary | ICD-10-CM

## 2015-07-11 LAB — CBC WITH DIFFERENTIAL/PLATELET
BASOS ABS: 0 {cells}/uL (ref 0–200)
BASOS PCT: 0 %
EOS PCT: 1 %
Eosinophils Absolute: 45 cells/uL (ref 15–500)
HCT: 43.9 % (ref 38.5–50.0)
Hemoglobin: 15.1 g/dL (ref 13.2–17.1)
LYMPHS PCT: 40 %
Lymphs Abs: 1800 cells/uL (ref 850–3900)
MCH: 29.2 pg (ref 27.0–33.0)
MCHC: 34.4 g/dL (ref 32.0–36.0)
MCV: 84.9 fL (ref 80.0–100.0)
MONOS PCT: 9 %
MPV: 10.7 fL (ref 7.5–12.5)
Monocytes Absolute: 405 cells/uL (ref 200–950)
NEUTROS ABS: 2250 {cells}/uL (ref 1500–7800)
Neutrophils Relative %: 50 %
PLATELETS: 215 10*3/uL (ref 140–400)
RBC: 5.17 MIL/uL (ref 4.20–5.80)
RDW: 13.4 % (ref 11.0–15.0)
WBC: 4.5 10*3/uL (ref 3.8–10.8)

## 2015-07-11 LAB — COMPREHENSIVE METABOLIC PANEL
ALT: 17 U/L (ref 9–46)
AST: 24 U/L (ref 10–40)
Albumin: 4.3 g/dL (ref 3.6–5.1)
Alkaline Phosphatase: 61 U/L (ref 40–115)
BILIRUBIN TOTAL: 0.6 mg/dL (ref 0.2–1.2)
BUN: 16 mg/dL (ref 7–25)
CALCIUM: 9.6 mg/dL (ref 8.6–10.3)
CO2: 30 mmol/L (ref 20–31)
CREATININE: 1.57 mg/dL — AB (ref 0.60–1.35)
Chloride: 100 mmol/L (ref 98–110)
GLUCOSE: 100 mg/dL — AB (ref 65–99)
Potassium: 3.7 mmol/L (ref 3.5–5.3)
Sodium: 140 mmol/L (ref 135–146)
Total Protein: 7.2 g/dL (ref 6.1–8.1)

## 2015-07-11 LAB — TSH: TSH: 0.69 m[IU]/L (ref 0.40–4.50)

## 2015-07-11 MED ORDER — MECLIZINE HCL 12.5 MG PO TABS: 12.5000 mg | ORAL_TABLET | Freq: Three times a day (TID) | ORAL | Status: AC | PRN

## 2015-07-11 NOTE — Patient Instructions (Signed)
For your transent dizziness sensation I want you to get labs today to see if any cause can be identified.  This weekend if you get dizzy sensation and you are not working then can try meclizine.  If you get dizziness with neurologic signs and symptoms as discussed then ED evaluation.   I want to follow this sensation and see how you do. Sometimes imaging is needed if persists and cause not found.  Also would recommend checking bp and pulse when you feel dizziness. You bp when I checked was 130/80. I think if you were to get reading like 140/90 this would be suspicious for cause.  Follow up in 7-10 days or as needed

## 2015-07-11 NOTE — Progress Notes (Signed)
Pre visit review using our clinic review tool, if applicable. No additional management support is needed unless otherwise documented below in the visit note. 

## 2015-07-11 NOTE — Progress Notes (Signed)
Subjective:    Patient ID: Preston EllisDeontrey Cox, male    DOB: 07/07/1975, 40 y.o.   MRN: [id number]  HPI  Pt in states he has been having some occasional transient dizziness sensation(he states some since last year this time of the year). . At times he feels like his head is a fog sensation. He feels like this sensation distracts him.  He denies any headaches. States hx of cluster ha but none for 4 years. These dizzy episodes may occur 3-4 time week. More in the am. This has occurred for about one year. See neuro ros  Occasional turns is head and will feel dizzy.  One day last weekend he states had hard time reading book.    Pt does not report any allergy symptoms recently.  Pt states at times he will drink caffeine beverage and feel better.     Review of Systems  Constitutional: Positive for fatigue. Negative for fever and chills.       Mild at brest  HENT: Negative for congestion, drooling and ear pain.   Cardiovascular: Negative for chest pain and palpitations.  Gastrointestinal: Negative for nausea, vomiting and abdominal pain.  Musculoskeletal: Negative for back pain.  Skin: Negative for rash.  Neurological: Positive for dizziness. Negative for syncope, facial asymmetry, speech difficulty, numbness and headaches.       But not dizzy currently.  maybe light headed. Pt has heard time describing.  Hematological: Negative for adenopathy. Does not bruise/bleed easily.  Psychiatric/Behavioral: Negative for hallucinations, behavioral problems and dysphoric mood. The patient is not nervous/anxious.     Past Medical History  Diagnosis Date  . Cluster headache      Social History   Social History  . Marital Status: Married    Spouse Name: N/A  . Number of Children: N/A  . Years of Education: N/A   Occupational History  . Not on file.   Social History Main Topics  . Smoking status: Never Smoker   . Smokeless tobacco: Not on file  . Alcohol Use: 0.0 oz/week    0 Standard  drinks or equivalent per week  . Drug Use: No  . Sexual Activity: Not on file   Other Topics Concern  . Not on file   Social History Narrative    Past Surgical History  Procedure Laterality Date  . Achilles tendon surgery  2008    Family History  Problem Relation Age of Onset  . Hypertension Mother   . Alcohol abuse Father     Allergies  Allergen Reactions  . Penicillins Other (See Comments)    Unsure childhood allergy     No current outpatient prescriptions on file prior to visit.   No current facility-administered medications on file prior to visit.    BP 114/78 mmHg  Pulse 77  Temp(Src) 98.3 F (36.8 C) (Oral)  Ht 6' (1.829 m)  Wt 213 lb 3.2 oz (96.707 kg)  BMI 28.91 kg/m2  SpO2 98%       Objective:   Physical Exam  General Mental Status- Alert. General Appearance- Not in acute distress.   Skin General: Color- Normal Color. Moisture- Normal Moisture.  Neck Carotid Arteries- Normal color. Moisture- Normal Moisture. No carotid bruits. No JVD.  Chest and Lung Exam Auscultation: Breath Sounds:-Normal.  Cardiovascular Auscultation:Rythm- Regular. Murmurs & Other Heart Sounds:Auscultation of the heart reveals- No Murmurs.  Abdomen Inspection:-Inspeection Normal. Palpation/Percussion:Note:No mass. Palpation and Percussion of the abdomen reveal- Non Tender, Non Distended + BS, no rebound or guarding.  Neurologic Cranial Nerve exam:- CN III-XII intact(No nystagmus), symmetric smile. Drift Test:- No drift. Romberg Exam:- Negative.  Heal to Toe Gait exam:-Normal. Finger to Nose:- Normal/Intact Strength:- 5/5 equal and symmetric strength both upper and lower extremities.      Assessment & Plan:  For your transent dizziness sensation I want you to get labs today to see if any cause can be identified.  This weekend if you get dizzy sensation and you are not working then can try meclizine.  If you get dizziness with neurologic signs and  symptoms as discussed then ED evaluation.   I want to follow this sensation and see how you do. Sometimes imaging is needed if persists and cause not found.  Also would recommend checking bp and pulse when you feel dizziness. You bp when I checked was 130/80. I think if you were to get reading like 140/90 this would be suspicious for cause.  Follow up in 7-10 days or as needed  Morelia Cassells, Ramon Dredge, [employer]

## 2016-09-30 IMAGING — CR DG RIBS W/ CHEST 3+V*R*
3 series · 3 of 3 positions shown · non-contrast
Comparison: None.

CLINICAL DATA: Right side rib in chest pain intermittent for 2
months. No known injury.

EXAM:
RIGHT RIBS AND CHEST - 3+ VIEW

[w chest pa]
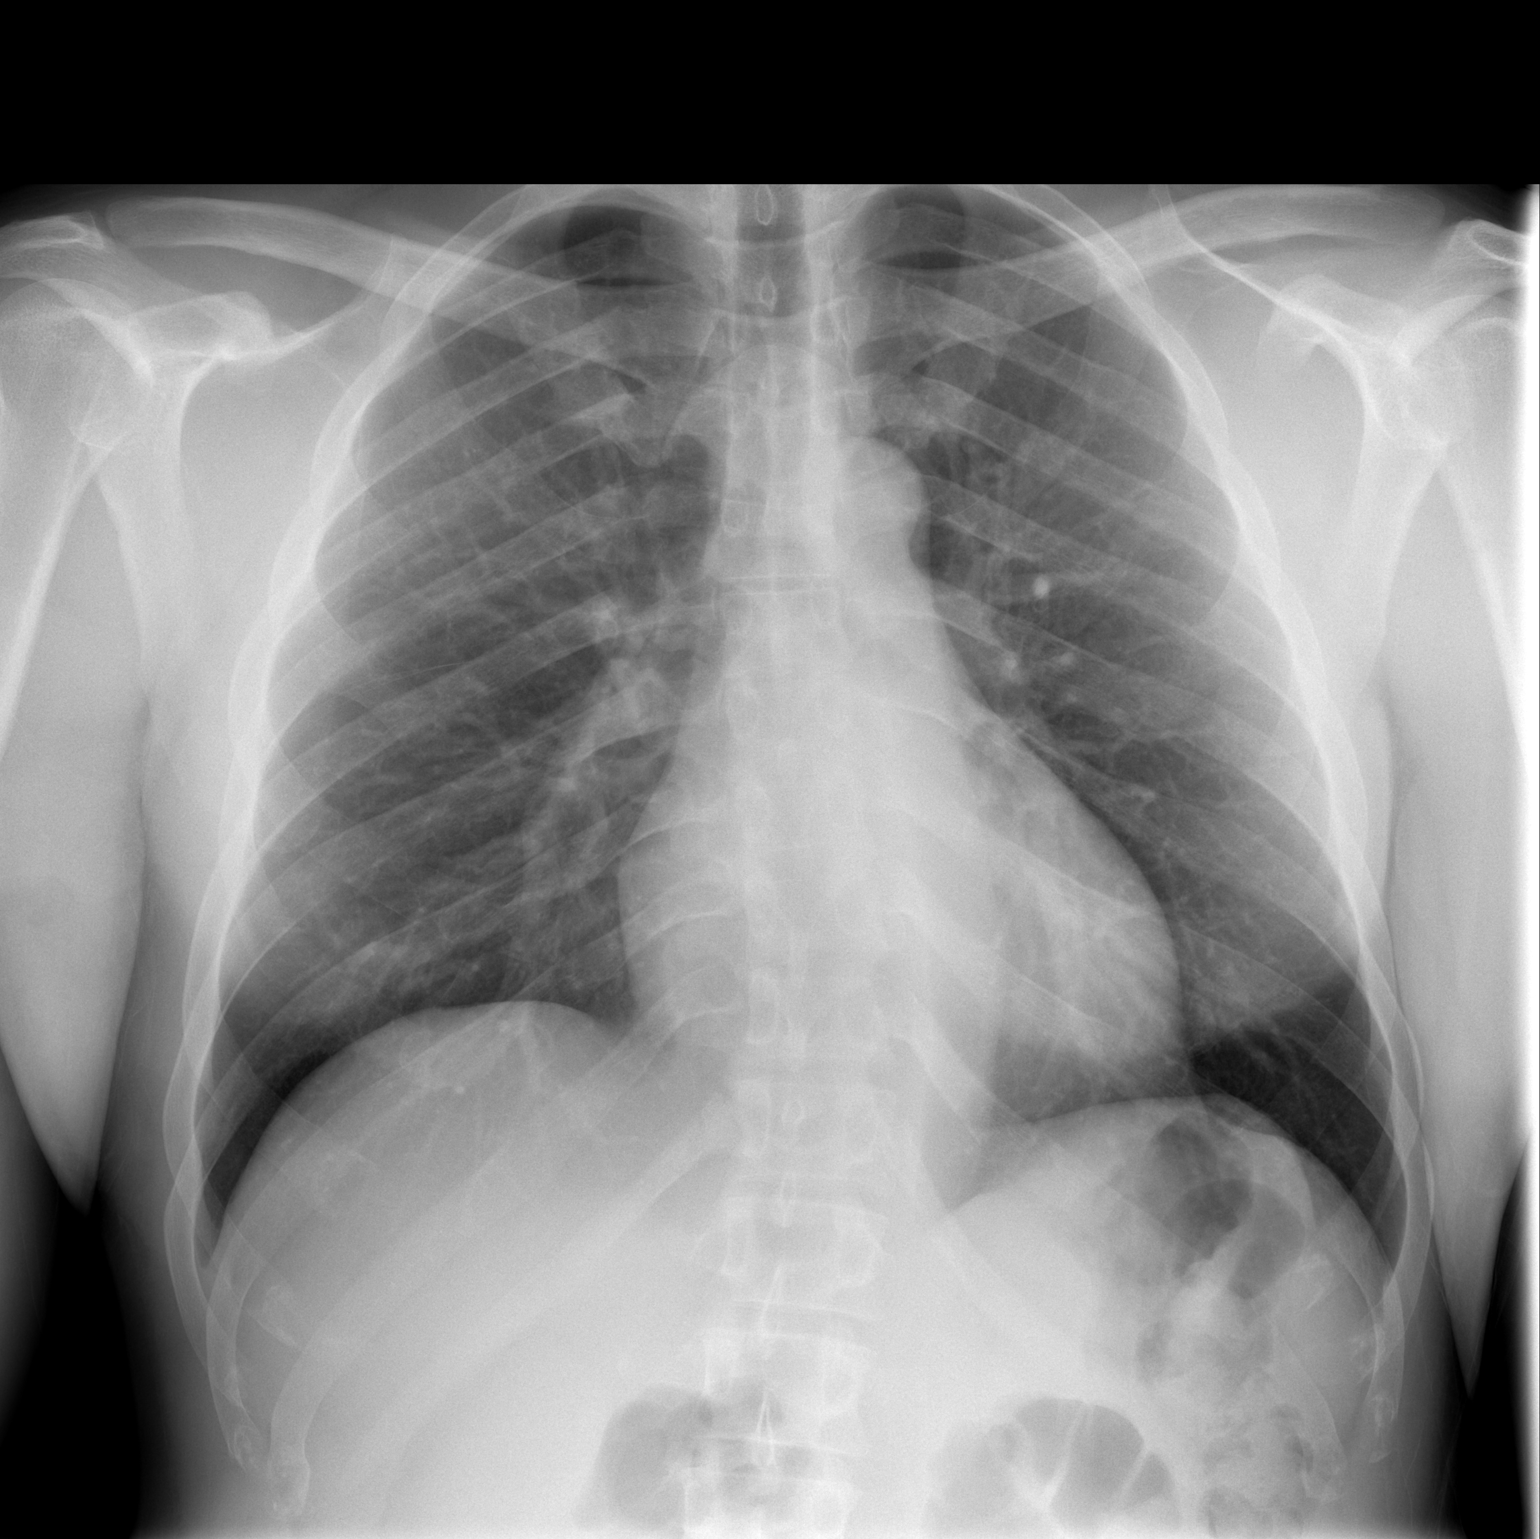

[w ribs ap/pa upper right]
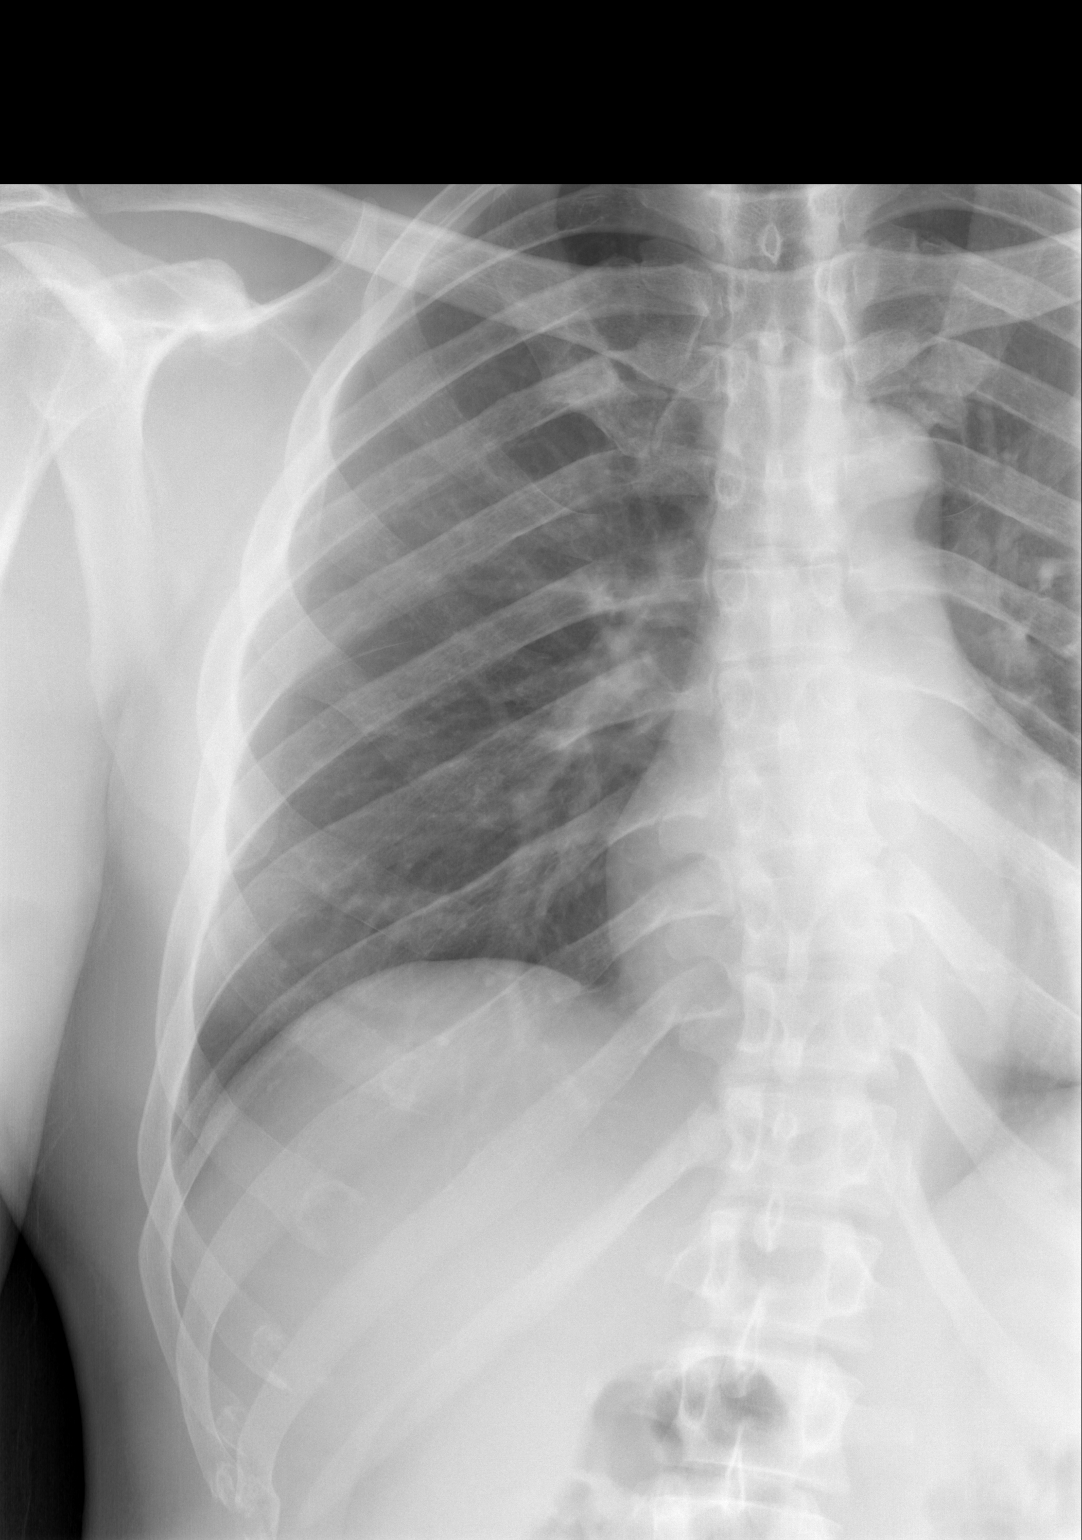

[w ribs oblique right]
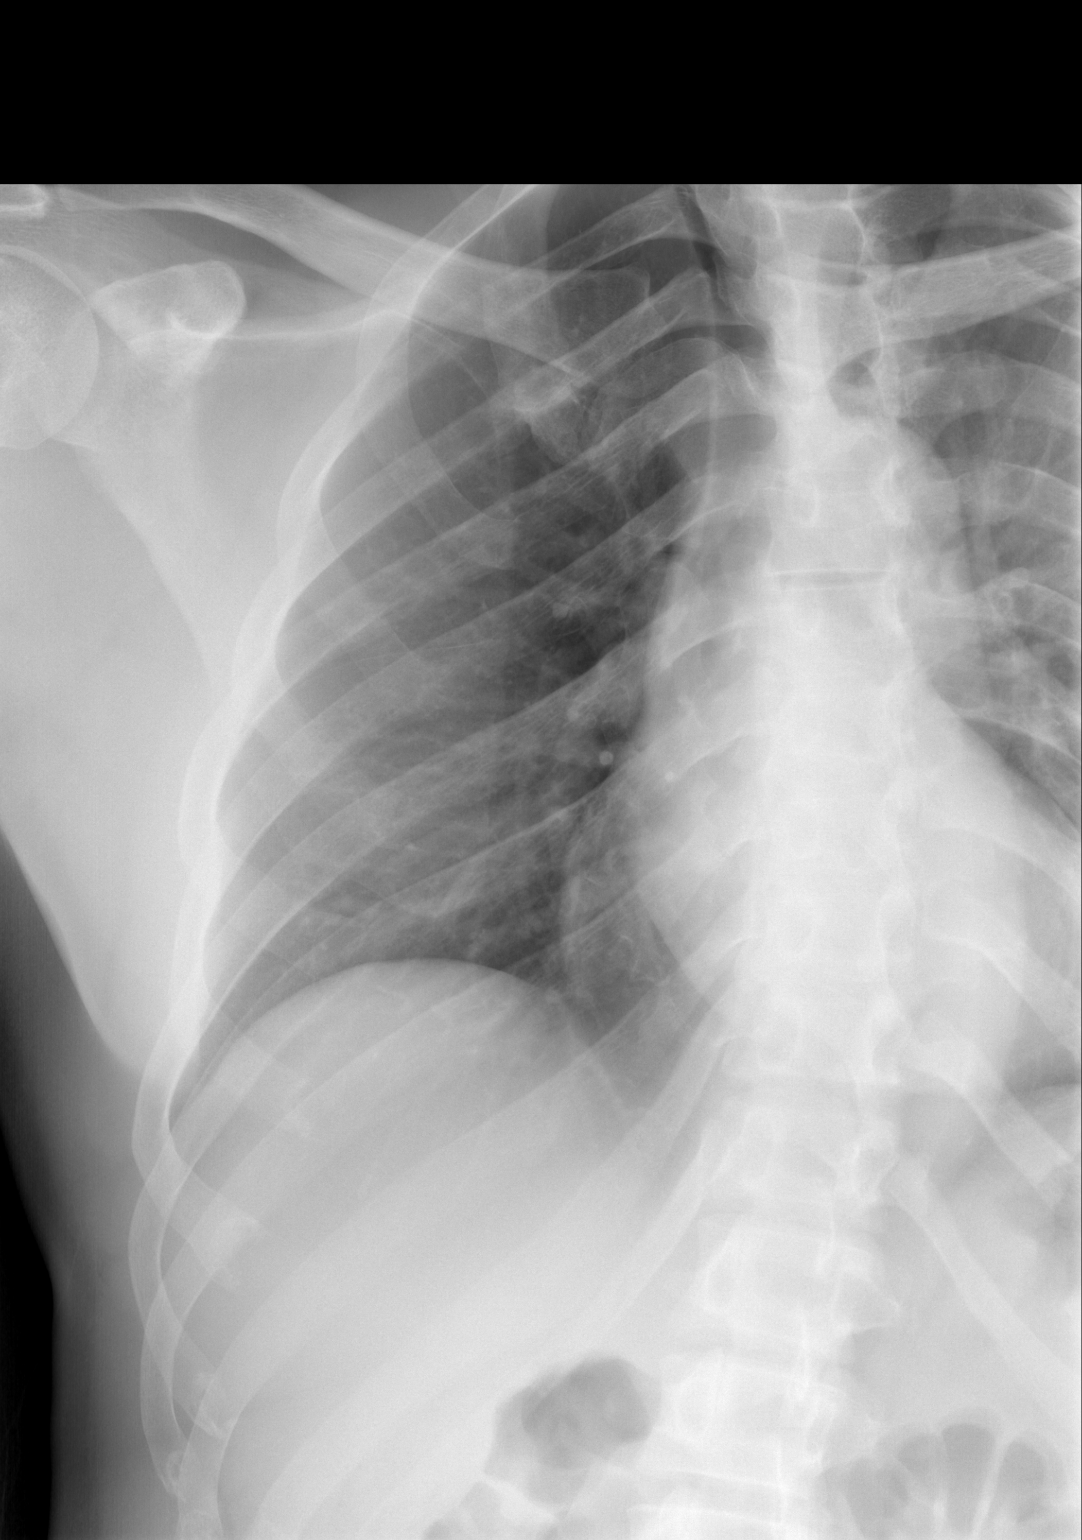

[3 of 3 positions shown; findings below may reference images not displayed]

FINDINGS: Heart and mediastinal contours are within normal limits. No focal
opacities or effusions. No acute bony abnormality. No visible rib
fracture. No pneumothorax.
IMPRESSION: Negative.

## 2017-08-03 ENCOUNTER — Encounter: Payer: Self-pay | Admitting: General Practice
